# Patient Record
Sex: Female | Born: 1980 | Race: White | Hispanic: No | Marital: Married | Smoking: Never smoker
Health system: Southern US, Community
[De-identification: ages and names within clinical notes are randomized; demographics above are authoritative.]

## PROBLEM LIST (undated history)

## (undated) DIAGNOSIS — D6851 Activated protein C resistance: Secondary | ICD-10-CM

## (undated) HISTORY — PX: NASAL POLYP EXCISION: SHX2068

## (undated) HISTORY — DX: Activated protein C resistance: D68.51

---

## 2011-10-05 ENCOUNTER — Ambulatory Visit: Payer: Self-pay

## 2011-11-01 ENCOUNTER — Ambulatory Visit: Payer: Self-pay

## 2012-03-23 ENCOUNTER — Ambulatory Visit: Payer: Self-pay | Admitting: Internal Medicine

## 2012-03-23 LAB — PREGNANCY, URINE: Pregnancy Test, Urine: NEGATIVE m[IU]/mL

## 2013-07-03 IMAGING — CR DG CHEST 2V
1 series · 2 of 2 positions shown · non-contrast
Comparison: none

REASON FOR EXAM: chest pain; SOB
COMMENTS:   LMP: One week ago

PROCEDURE:     MDR - MDR CHEST PA(OR AP) AND LATERAL  - March 23, 2012  [DATE]
RESULT:     The lungs are clear. The heart and pulmonary vessels are normal.
The bony and mediastinal structures are unremarkable. There is no effusion.
There is no pneumothorax or evidence of congestive failure.

[Series 1: pa · 0.17mm/px · 2 of 2 slices shown]
[im 1/2]
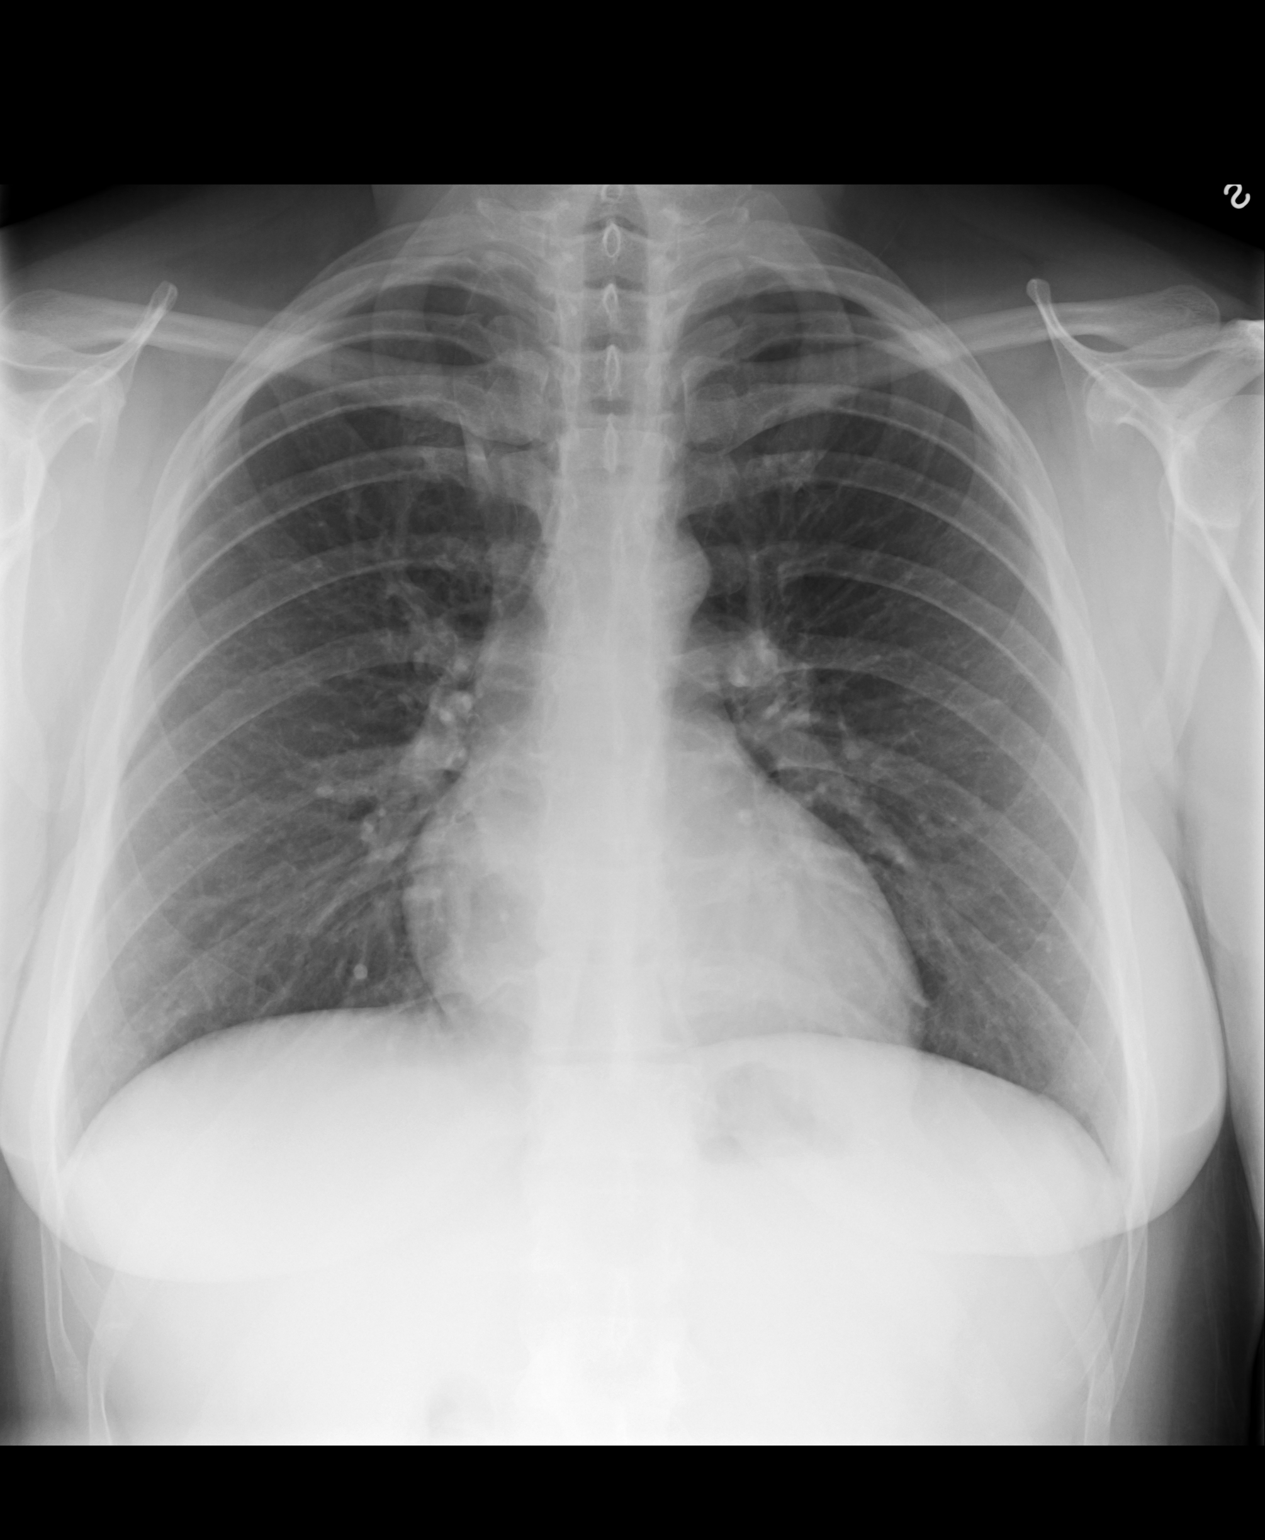
[im 2/2]
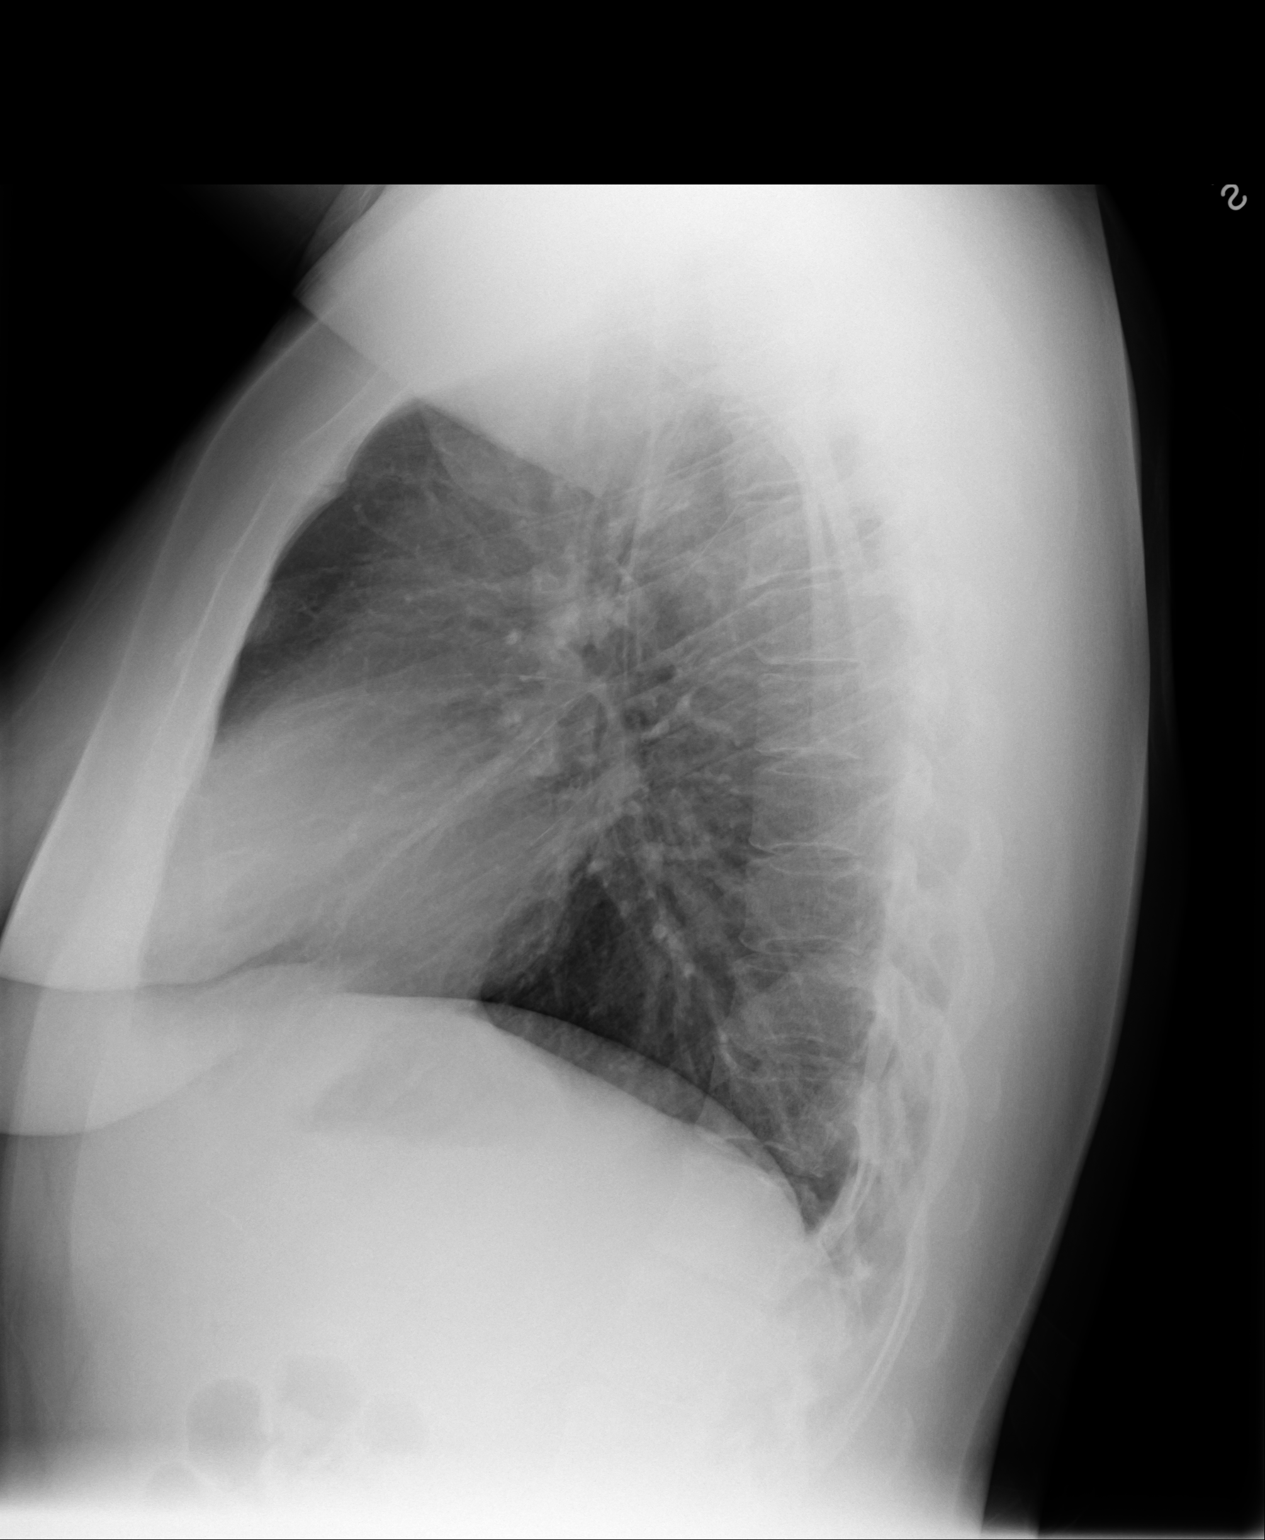

[2 of 2 positions shown; findings below may reference images not displayed]

IMPRESSION: No acute cardiopulmonary disease.

[REDACTED]

## 2013-12-02 ENCOUNTER — Ambulatory Visit: Payer: Self-pay | Admitting: Family Medicine

## 2014-11-02 ENCOUNTER — Encounter: Payer: Self-pay | Admitting: Obstetrics and Gynecology

## 2015-03-21 ENCOUNTER — Encounter: Payer: Self-pay | Admitting: Obstetrics and Gynecology

## 2015-04-20 ENCOUNTER — Encounter: Payer: Self-pay | Admitting: Obstetrics and Gynecology

## 2015-04-20 ENCOUNTER — Ambulatory Visit (INDEPENDENT_AMBULATORY_CARE_PROVIDER_SITE_OTHER): Payer: BLUE CROSS/BLUE SHIELD | Admitting: Obstetrics and Gynecology

## 2015-04-20 VITALS — BP 137/70 | HR 75 | Ht 67.5 in | Wt 161.8 lb

## 2015-04-20 DIAGNOSIS — Z01419 Encounter for gynecological examination (general) (routine) without abnormal findings: Secondary | ICD-10-CM | POA: Diagnosis not present

## 2015-04-20 NOTE — Patient Instructions (Addendum)
Place annual gynecologic exam patient instructions here.  Thank you for enrolling in MyChart. Please follow the instructions below to securely access your online medical record. MyChart allows you to send messages to your doctor, view your test results, manage appointments, and more.   How Do I Sign Up? 1. In your Internet browser, go to [employer] and enter [email or web address]. 2. Click on the Sign Up Now link in the Sign In box. You will see the New Member Sign Up page. 3. Enter your MyChart Access Code exactly as it appears below. You will not need to use this code after you've completed the sign-up process. If you do not sign up before the expiration date, you must request a new code.  MyChart Access Code: [email or web address] Expires: 05/11/2015  8:48 AM  4. Enter your Social Security Number ([id number]) and Date of Birth (mm/dd/yyyy) as indicated and click Submit. You will be taken to the next sign-up page. 5. Create a MyChart ID. This will be your MyChart login ID and cannot be changed, so think of one that is secure and easy to remember. 6. Create a MyChart password. You can change your password at any time. 7. Enter your Password Reset Question and Answer. This can be used at a later time if you forget your password.  8. Enter your e-mail address. You will receive e-mail notification when new information is available in MyChart. 9. Click Sign Up. You can now view your medical record.   Additional Information Remember, MyChart is NOT to be used for urgent needs. For medical emergencies, dial 911.   Levonorgestrel intrauterine device (IUD) What is this medicine? LEVONORGESTREL IUD (LEE voe nor jes trel) is a contraceptive (birth control) device. The device is placed inside the uterus by a healthcare professional. It is used to prevent pregnancy and can also be used to treat heavy bleeding that occurs during your period. Depending on the device, it can be used for 3 to 5  years. This medicine may be used for other purposes; ask your health care provider or pharmacist if you have questions. What should I tell my health care provider before I take this medicine? They need to know if you have any of these conditions: -abnormal Pap smear -cancer of the breast, uterus, or cervix -diabetes -endometritis -genital or pelvic infection now or in the past -have more than one sexual partner or your partner has more than one partner -heart disease -history of an ectopic or tubal pregnancy -immune system problems -IUD in place -liver disease or tumor -problems with blood clots or take blood-thinners -use intravenous drugs -uterus of unusual shape -vaginal bleeding that has not been explained -an unusual or allergic reaction to levonorgestrel, other hormones, silicone, or polyethylene, medicines, foods, dyes, or preservatives -pregnant or trying to get pregnant -breast-feeding How should I use this medicine? This device is placed inside the uterus by a health care professional. Talk to your pediatrician regarding the use of this medicine in children. Special care may be needed. Overdosage: If you think you have taken too much of this medicine contact a poison control center or emergency room at once. NOTE: This medicine is only for you. Do not share this medicine with others. What if I miss a dose? This does not apply. What may interact with this medicine? Do not take this medicine with any of the following medications: -amprenavir -bosentan -fosamprenavir This medicine may also interact with the following medications: -aprepitant -barbiturate medicines for inducing sleep or treating  seizures -bexarotene -griseofulvin -medicines to treat seizures like carbamazepine, ethotoin, felbamate, oxcarbazepine, phenytoin, topiramate -modafinil -pioglitazone -rifabutin -rifampin -rifapentine -some medicines to treat HIV infection like atazanavir, indinavir,  lopinavir, nelfinavir, tipranavir, ritonavir -St. John's wort -warfarin This list may not describe all possible interactions. Give your health care provider a list of all the medicines, herbs, non-prescription drugs, or dietary supplements you use. Also tell them if you smoke, drink alcohol, or use illegal drugs. Some items may interact with your medicine. What should I watch for while using this medicine? Visit your doctor or health care professional for regular check ups. See your doctor if you or your partner has sexual contact with others, becomes HIV positive, or gets a sexual transmitted disease. This product does not protect you against HIV infection (AIDS) or other sexually transmitted diseases. You can check the placement of the IUD yourself by reaching up to the top of your vagina with clean fingers to feel the threads. Do not pull on the threads. It is a good habit to check placement after each menstrual period. Call your doctor right away if you feel more of the IUD than just the threads or if you cannot feel the threads at all. The IUD may come out by itself. You may become pregnant if the device comes out. If you notice that the IUD has come out use a backup birth control method like condoms and call your health care provider. Using tampons will not change the position of the IUD and are okay to use during your period. What side effects may I notice from receiving this medicine? Side effects that you should report to your doctor or health care professional as soon as possible: -allergic reactions like skin rash, itching or hives, swelling of the face, lips, or tongue -fever, flu-like symptoms -genital sores -high blood pressure -no menstrual period for 6 weeks during use -pain, swelling, warmth in the leg -pelvic pain or tenderness -severe or sudden headache -signs of pregnancy -stomach cramping -sudden shortness of breath -trouble with balance, talking, or walking -unusual  vaginal bleeding, discharge -yellowing of the eyes or skin Side effects that usually do not require medical attention (report to your doctor or health care professional if they continue or are bothersome): -acne -breast pain -change in sex drive or performance -changes in weight -cramping, dizziness, or faintness while the device is being inserted -headache -irregular menstrual bleeding within first 3 to 6 months of use -nausea This list may not describe all possible side effects. Call your doctor for medical advice about side effects. You may report side effects to FDA at 1-800-FDA-1088. Where should I keep my medicine? This does not apply. NOTE: This sheet is a summary. It may not cover all possible information. If you have questions about this medicine, talk to your doctor, pharmacist, or health care provider.    2016, [employer]vier/[employer] Standard. (2011-05-15 13:54:04)

## 2015-04-20 NOTE — Progress Notes (Signed)
  Subjective:     Rebecca Branch is a 34 y.o. female and is here for a comprehensive physical exam. 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  Social History   Social History  . Marital Status: Married    Spouse Name: N/A  . Number of Children: N/A  . Years of Education: N/A   Occupational History  . Not on file.   Social History Main Topics  . Smoking status: Never Smoker   . Smokeless tobacco: Never Used  . Alcohol Use: No  . Drug Use: No  . Sexual Activity: Yes    Birth Control/ Protection: None     Comment: husband-vasectomy   Other Topics Concern  . Not on file   Social History Narrative  . No narrative on file   Health Maintenance  Topic Date Due  . HIV Screening  05/09/1994  . TETANUS/TDAP  05/09/1998  . PAP SMEAR  05/09/2000  . INFLUENZA VACCINE  11/27/2014    The following portions of the patient's history were reviewed and updated as appropriate: allergies, current medications, past family history, past medical history, past social history, past surgical history and problem list.  Review of Systems Pertinent items noted in HPI and remainder of comprehensive ROS otherwise negative.   Objective:    General appearance: alert, cooperative and appears stated age Neck: no adenopathy, no carotid bruit, no JVD, supple, symmetrical, trachea midline and thyroid not enlarged, symmetric, no tenderness/mass/nodules Lungs: clear to auscultation bilaterally Breasts: normal appearance, no masses or tenderness Heart: regular rate and rhythm, S1, S2 normal, no murmur, click, rub or gallop Abdomen: soft, non-tender; bowel sounds normal; no masses,  no organomegaly Pelvic: cervix normal in appearance, external genitalia normal, no adnexal masses or tenderness, no cervical motion tenderness, rectovaginal septum normal, uterus normal size, shape, and consistency and vagina normal without discharge    Assessment:    Healthy female exam.  Factor V mutation Menorrhagia Needs flu vaccine     Plan:  Pap & labs obtained Mirena discussed for menstrual control RTC 1 year or prn Flu vaccine given   See After Visit Summary for Counseling Recommendations

## 2015-04-21 LAB — COMPREHENSIVE METABOLIC PANEL
ALT: 15 IU/L (ref 0–32)
AST: 17 IU/L (ref 0–40)
Albumin/Globulin Ratio: 1.7 (ref 1.1–2.5)
Albumin: 4.1 g/dL (ref 3.5–5.5)
Alkaline Phosphatase: 66 IU/L (ref 39–117)
BILIRUBIN TOTAL: 0.3 mg/dL (ref 0.0–1.2)
BUN/Creatinine Ratio: 19 (ref 8–20)
BUN: 17 mg/dL (ref 6–20)
CHLORIDE: 104 mmol/L (ref 96–106)
CO2: 26 mmol/L (ref 18–29)
Calcium: 8.8 mg/dL (ref 8.7–10.2)
Creatinine, Ser: 0.89 mg/dL (ref 0.57–1.00)
GFR calc non Af Amer: 84 mL/min/{1.73_m2} (ref >59)
GFR, EST AFRICAN AMERICAN: 97 mL/min/{1.73_m2} (ref >59)
Globulin, Total: 2.4 g/dL (ref 1.5–4.5)
Glucose: 86 mg/dL (ref 65–99)
POTASSIUM: 4.6 mmol/L (ref 3.5–5.2)
Sodium: 141 mmol/L (ref 134–144)
TOTAL PROTEIN: 6.5 g/dL (ref 6.0–8.5)

## 2015-04-21 LAB — CBC
HEMATOCRIT: 39.2 % (ref 34.0–46.6)
Hemoglobin: 12.9 g/dL (ref 11.1–15.9)
MCH: 29.4 pg (ref 26.6–33.0)
MCHC: 32.9 g/dL (ref 31.5–35.7)
MCV: 89 fL (ref 79–97)
Platelets: 180 10*3/uL (ref 150–379)
RBC: 4.39 x10E6/uL (ref 3.77–5.28)
RDW: 13.7 % (ref 12.3–15.4)
WBC: 5.8 10*3/uL (ref 3.4–10.8)

## 2015-04-21 LAB — LIPID PANEL
CHOL/HDL RATIO: 4.3 ratio (ref 0.0–4.4)
Cholesterol, Total: 163 mg/dL (ref 100–199)
HDL: 38 mg/dL — AB (ref >39)
LDL CALC: 111 mg/dL — AB (ref 0–99)
TRIGLYCERIDES: 72 mg/dL (ref 0–149)
VLDL Cholesterol Cal: 14 mg/dL (ref 5–40)

## 2015-04-21 LAB — PROTIME-INR
INR: 1 (ref 0.8–1.2)
Prothrombin Time: 10.4 s (ref 9.1–12.0)

## 2015-04-21 LAB — VITAMIN B12: Vitamin B-12: 980 pg/mL — ABNORMAL HIGH (ref 211–946)

## 2015-04-21 LAB — VITAMIN D 25 HYDROXY (VIT D DEFICIENCY, FRACTURES): Vit D, 25-Hydroxy: 40.4 ng/mL (ref 30.0–100.0)

## 2015-04-21 LAB — FERRITIN: Ferritin: 21 ng/mL (ref 15–150)

## 2015-04-21 LAB — APTT: aPTT: 27 s (ref 24–33)

## 2015-04-26 ENCOUNTER — Encounter: Payer: Self-pay | Admitting: Obstetrics and Gynecology

## 2015-05-02 ENCOUNTER — Encounter: Payer: Self-pay | Admitting: Obstetrics and Gynecology

## 2015-05-30 ENCOUNTER — Telehealth: Payer: Self-pay | Admitting: Obstetrics and Gynecology

## 2015-05-30 NOTE — Telephone Encounter (Signed)
This pt had labs at our [facility].12/23/ they need for you to put in additional diagnosis codes so her insurance will pay for them. Call [place] [phone] ext [phone]

## 2015-06-14 NOTE — Telephone Encounter (Signed)
Taken care of by Wayne Both

## 2016-04-23 ENCOUNTER — Encounter: Payer: BLUE CROSS/BLUE SHIELD | Admitting: Obstetrics and Gynecology
